# Patient Record
Sex: Female | Born: 1980 | Race: White | Hispanic: No | Marital: Married | Smoking: Never smoker
Health system: Southern US, Community
[De-identification: ages and names within clinical notes are randomized; demographics above are authoritative.]

## PROBLEM LIST (undated history)

## (undated) DIAGNOSIS — T7840XA Allergy, unspecified, initial encounter: Secondary | ICD-10-CM

## (undated) DIAGNOSIS — K5900 Constipation, unspecified: Secondary | ICD-10-CM

## (undated) DIAGNOSIS — B070 Plantar wart: Secondary | ICD-10-CM

## (undated) DIAGNOSIS — L609 Nail disorder, unspecified: Secondary | ICD-10-CM

## (undated) DIAGNOSIS — R079 Chest pain, unspecified: Secondary | ICD-10-CM

## (undated) DIAGNOSIS — F419 Anxiety disorder, unspecified: Secondary | ICD-10-CM

## (undated) HISTORY — DX: Nail disorder, unspecified: L60.9

## (undated) HISTORY — DX: Constipation, unspecified: K59.00

## (undated) HISTORY — DX: Anxiety disorder, unspecified: F41.9

## (undated) HISTORY — DX: Plantar wart: B07.0

## (undated) HISTORY — DX: Chest pain, unspecified: R07.9

## (undated) HISTORY — DX: Allergy, unspecified, initial encounter: T78.40XA

---

## 2019-02-23 HISTORY — PX: APPENDECTOMY: SHX54

## 2019-03-02 DIAGNOSIS — Z9049 Acquired absence of other specified parts of digestive tract: Secondary | ICD-10-CM | POA: Insufficient documentation

## 2020-06-01 LAB — RESULTS CONSOLE HPV: CHL HPV: NEGATIVE

## 2020-06-01 LAB — HM PAP SMEAR: HM Pap smear: NEGATIVE

## 2020-06-04 ENCOUNTER — Other Ambulatory Visit: Payer: Self-pay

## 2020-06-04 ENCOUNTER — Encounter: Payer: Self-pay | Admitting: Physician Assistant

## 2020-06-04 ENCOUNTER — Ambulatory Visit (INDEPENDENT_AMBULATORY_CARE_PROVIDER_SITE_OTHER): Payer: BC Managed Care – PPO | Admitting: Physician Assistant

## 2020-06-04 ENCOUNTER — Other Ambulatory Visit: Payer: Self-pay | Admitting: Obstetrics and Gynecology

## 2020-06-04 VITALS — BP 112/80 | HR 74 | Temp 98.3°F | Resp 14 | Ht 62.5 in | Wt 125.0 lb

## 2020-06-04 DIAGNOSIS — K5901 Slow transit constipation: Secondary | ICD-10-CM | POA: Diagnosis not present

## 2020-06-04 DIAGNOSIS — U099 Post covid-19 condition, unspecified: Secondary | ICD-10-CM

## 2020-06-04 DIAGNOSIS — R079 Chest pain, unspecified: Secondary | ICD-10-CM

## 2020-06-04 DIAGNOSIS — B070 Plantar wart: Secondary | ICD-10-CM

## 2020-06-04 DIAGNOSIS — N6320 Unspecified lump in the left breast, unspecified quadrant: Secondary | ICD-10-CM

## 2020-06-04 MED ORDER — IMIQUIMOD 5 % EX CREA: TOPICAL_CREAM | CUTANEOUS | 2 refills | Status: AC
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
## 2020-06-04 NOTE — Patient Instructions (Signed)
Please schedule for a Chest XRAY in our office at your convenience. Someone will call about referral to cardiology. Use Aldara cream as directed for plantar wart. Call in the next 3 months if not improved.  Be sure to drink plenty of water and healthy fiber intake. Great job taking care of yourself and running! Pleasure meeting you today.

## 2020-06-08 ENCOUNTER — Other Ambulatory Visit: Payer: Self-pay

## 2020-06-08 ENCOUNTER — Ambulatory Visit (INDEPENDENT_AMBULATORY_CARE_PROVIDER_SITE_OTHER): Payer: BC Managed Care – PPO

## 2020-06-08 ENCOUNTER — Other Ambulatory Visit: Payer: BC Managed Care – PPO

## 2020-06-08 DIAGNOSIS — R079 Chest pain, unspecified: Secondary | ICD-10-CM

## 2020-06-08 DIAGNOSIS — U099 Post covid-19 condition, unspecified: Secondary | ICD-10-CM
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
## 2020-06-17 ENCOUNTER — Other Ambulatory Visit: Payer: Self-pay

## 2020-06-17 ENCOUNTER — Ambulatory Visit: Payer: BC Managed Care – PPO | Admitting: Cardiovascular Disease

## 2020-06-17 ENCOUNTER — Encounter: Payer: Self-pay | Admitting: Cardiovascular Disease

## 2020-06-17 VITALS — BP 116/70 | HR 69 | Ht 63.0 in | Wt 125.0 lb

## 2020-06-17 DIAGNOSIS — R079 Chest pain, unspecified: Secondary | ICD-10-CM | POA: Diagnosis not present

## 2020-06-17 NOTE — Patient Instructions (Signed)
Medication Instructions:  *If you need a refill on your cardiac medications before your next appointment, please call your pharmacy*   Lab Work: If you have labs (blood work) drawn today and your tests are completely normal, you will receive your results only by: Marland Kitchen MyChart Message (if you have MyChart) OR . A paper copy in the mail If you have any lab test that is abnormal or we need to change your treatment, we will call you to review the results.  Testing/Procedures: Your physician has requested that you have an echocardiogram. Echocardiography is a painless test that uses sound waves to create images of your heart. It provides your doctor with information about the size and shape of your heart and how well your heart's chambers and valves are working. This procedure takes approximately one hour. There are no restrictions for this procedure.  Cardiac CT scanning for Calcium score, (CAT scanning), is a noninvasive, special x-ray that produces cross-sectional images of the body using x-rays and a computer. CT scans help physicians diagnose and treat medical conditions. For some CT exams, a contrast material is used to enhance visibility in the area of the body being studied. CT scans provide greater clarity and reveal more details than regular x-ray exams.   Follow-Up: At [facility], you and your health needs are our priority.  As part of our continuing mission to provide you with exceptional heart care, we have created designated Provider Care Teams.  These Care Teams include your primary Cardiologist (physician) and Advanced Practice Providers (APPs -  Physician Assistants and Nurse Practitioners) who all work together to provide you with the care you need, when you need it.  We recommend signing up for the patient portal called "MyChart".  Sign up information is provided on this After Visit Summary.  MyChart is used to connect with patients for Virtual Visits (Telemedicine).  Patients are  able to view lab/test results, encounter notes, upcoming appointments, etc.  Non-urgent messages can be sent to your provider as well.   To learn more about what you can do with MyChart, go to [email or web address].    Your next appointment:   As needed  The format for your next appointment:   In Person  Provider:   You may see Dr. Eden Emms or one of the following Advanced Practice Providers on your designated Care Team:    Georgie Chard, NP

## 2020-06-18 ENCOUNTER — Ambulatory Visit (INDEPENDENT_AMBULATORY_CARE_PROVIDER_SITE_OTHER)
Admission: RE | Admit: 2020-06-18 | Discharge: 2020-06-18 | Disposition: A | Discharge status: Home / Self Care | Payer: Self-pay | Source: Ambulatory Visit | Attending: Cardiovascular Disease | Admitting: Cardiovascular Disease

## 2020-06-18 ENCOUNTER — Ambulatory Visit (HOSPITAL_COMMUNITY): Payer: BC Managed Care – PPO | Attending: Internal Medicine

## 2020-06-18 DIAGNOSIS — R079 Chest pain, unspecified: Secondary | ICD-10-CM

## 2020-06-18 LAB — ECHOCARDIOGRAM COMPLETE
Area-P 1/2: 3.17 cm2
S' Lateral: 2.6 cm

## 2020-07-16 ENCOUNTER — Other Ambulatory Visit: Payer: Self-pay

## 2020-08-03 ENCOUNTER — Other Ambulatory Visit: Payer: Self-pay

## 2021-07-18 ENCOUNTER — Ambulatory Visit: Payer: BC Managed Care – PPO | Admitting: Physician Assistant

## 2021-07-18 VITALS — BP 102/66 | HR 76 | Temp 98.3°F | Ht 63.0 in | Wt 123.2 lb

## 2021-07-18 DIAGNOSIS — B309 Viral conjunctivitis, unspecified: Secondary | ICD-10-CM | POA: Diagnosis not present

## 2021-07-18 NOTE — Progress Notes (Signed)
? ?Subjective:  ? ? Patient ID: Katrina Schneider, female    DOB: 1980-05-10, 41 y.o.   MRN: [id number] ? ?Chief Complaint  ?Patient presents with  ? Conjunctivitis  ? ? ?Conjunctivitis  ? ?Patient is in today for ? Left eye conjunctivitis. Started this morning. White goop coming out of eye. Some pain, itching, and burning. No change in vision. Works with kindergartners. Renato Gails recently with pink eye as well.  ? ?Started feeling sick on Friday afternoon - felt like she was having hot/cold chills. Some congestion. Still able to run 12 miles yesterday. No cough. No headache. No dizziness. Just doesn't feel 100% she says.  ? ? ?Past Medical History:  ?Diagnosis Date  ? Allergy   ? Anxiety   ? Chest pain   ? Constipation   ? Nail problem   ? Plantar wart of right foot   ? ? ?Past Surgical History:  ?Procedure Laterality Date  ? APPENDECTOMY  02/2019  ? ? ?Family History  ?Problem Relation Age of Onset  ? Heart attack Maternal Grandmother   ? Heart attack Maternal Grandfather   ? Heart attack Paternal Grandmother   ? Drug abuse Paternal Grandfather   ? ? ?Social History  ? ?Tobacco Use  ? Smoking status: Never  ? Smokeless tobacco: Never  ?Vaping Use  ? Vaping Use: Never used  ?Substance Use Topics  ? Alcohol use: Yes  ?  Alcohol/week: 3.0 standard drinks  ?  Types: 3 Glasses of wine per week  ? Drug use: Never  ?  ? ?Allergies  ?Allergen Reactions  ? Cat Hair Extract Hives and Itching  ? ? ?Review of Systems ?NEGATIVE UNLESS OTHERWISE INDICATED IN HPI ? ? ?   ?Objective:  ?  ? ?BP 102/66   Pulse 76   Temp 98.3 ?F (36.8 ?C)   Ht 5\' 3"  (1.6 m)   Wt 123 lb 3.2 oz (55.9 kg)   SpO2 98%   BMI 21.82 kg/m?  ? ?Wt Readings from Last 3 Encounters:  ?07/18/21 123 lb 3.2 oz (55.9 kg)  ?06/17/20 125 lb (56.7 kg)  ?06/04/20 125 lb (56.7 kg)  ? ? ?BP Readings from Last 3 Encounters:  ?07/18/21 102/66  ?06/17/20 116/70  ?06/04/20 112/80  ?  ? ?Physical Exam ?Vitals and nursing note reviewed.  ?Constitutional:   ?   Appearance: Normal  appearance.  ?HENT:  ?   Head: Normocephalic.  ?   Right Ear: Tympanic membrane and external ear normal.  ?   Left Ear: Tympanic membrane and external ear normal.  ?   Nose: Congestion present.  ?   Mouth/Throat:  ?   Mouth: Mucous membranes are moist.  ?   Pharynx: No oropharyngeal exudate or posterior oropharyngeal erythema.  ?Eyes:  ?   General:     ?   Left eye: No foreign body, discharge or hordeolum.  ?   Extraocular Movements: Extraocular movements intact.  ?   Conjunctiva/sclera:  ?   Left eye: Left conjunctiva is injected (mild).  ?   Pupils: Pupils are equal, round, and reactive to light.  ?Cardiovascular:  ?   Rate and Rhythm: Normal rate and regular rhythm.  ?   Pulses: Normal pulses.  ?   Heart sounds: Normal heart sounds.  ?Pulmonary:  ?   Effort: Pulmonary effort is normal.  ?   Breath sounds: Normal breath sounds.  ?Neurological:  ?   Mental Status: She is alert.  ?Psychiatric:     ?  Mood and Affect: Mood normal.  ? ? ?   ?Assessment & Plan:  ? ?Problem List Items Addressed This Visit   ?None ?Visit Diagnoses   ? ? Acute viral conjunctivitis of left eye    -  Primary  ? ?  ? ? ?Acute viral conjunctivitis of left eye ?Suspect viral conjunctivitis given other symptoms of congestion and some chills and fatigue.  As she works in a kindergarten setting, not unreasonable to start on polymyxin antibiotic drops that she already has at home; however, suggested to her if no improvement in 48 hours with those drops, discontinue and instead just focus on warm compresses and letting this run its course. Pt to keep me updated. Recheck prn.  ? ?This note was prepared with assistance of Dragon voice recognition software. Occasional wrong-word or sound-a-like substitutions may have occurred due to the inherent limitations of voice recognition software. ? ? ? ?Nafeesa Dils M Kambry Takacs, PA-C ?

## 2021-09-26 ENCOUNTER — Telehealth: Payer: Self-pay | Admitting: Physician Assistant

## 2021-09-26 NOTE — Telephone Encounter (Signed)
Caller states they will be traveling to [place] the end of July and need to know what vaccines they need to get to go to [place]. Want to know if they can get it done at the office  Please advise

## 2021-09-26 NOTE — Telephone Encounter (Signed)
Called pt and wanting to do Titer blood work to see if she needs any immunizations before going to [place]. Can we place an order for this so pt can get this done in our office and she will schedule an appt for visit to go over results.

## 2021-09-27 NOTE — Telephone Encounter (Signed)
Called pt to advise she would still need to come in for an office visit first to discuss medications and possible vaccines; pt stated she was trying to not have to come in but once to avoid co pays and potential costs. She wanted to speak with husband and would call back at another time to schedule an office visit.

## 2021-10-06 ENCOUNTER — Encounter: Payer: Self-pay | Admitting: Physician Assistant

## 2021-10-06 ENCOUNTER — Ambulatory Visit: Payer: BC Managed Care – PPO | Admitting: Physician Assistant

## 2021-10-06 VITALS — BP 112/68 | HR 77 | Temp 98.4°F | Ht 63.0 in | Wt 125.4 lb

## 2021-10-06 DIAGNOSIS — Z7184 Encounter for health counseling related to travel: Secondary | ICD-10-CM

## 2021-10-06 DIAGNOSIS — Z0184 Encounter for antibody response examination: Secondary | ICD-10-CM | POA: Diagnosis not present

## 2021-10-06 DIAGNOSIS — Z23 Encounter for immunization: Secondary | ICD-10-CM

## 2021-10-06 MED ORDER — ATOVAQUONE-PROGUANIL HCL 62.5-25 MG PO TABS: ORAL_TABLET | ORAL | 0 refills | Status: AC

## 2021-10-06 NOTE — Patient Instructions (Addendum)
Malarone dosing per CDC: Both adults and children should take one dose of atovaquone-proguanil per day starting a day or two before traveling to the area where malaria transmission occurs. They should take one dose per day while there, and for 7 consecutive days after leaving.  Tetanus updated today  Test for titers Hep A, Hep B, MMR  Already immune to chickenpox (natural immunity)  Polio - 1 dose, 4 weeks spacing, then 2nd dose; receive 3rd dose when back from trip

## 2021-10-06 NOTE — Progress Notes (Unsigned)
 Subjective:    Patient ID: Katrina Schneider, female    DOB: 05/16/1980, 41 y.o.   MRN: [id number]  Chief Complaint  Patient presents with  . Immunizations    Pt in to discuss going to [place] and vaccines that may be needed for her before going; pt has no way of accessing vaccine records from the past, nothing other than Covid reported in [place] registry; pt is wanting to know can she do a titer to see what she needs as far as vaccines;    HPI Patient is in today for travel advice. Leaving July 27th, will be gone 10 days.   Traveling to [place], [place], and then [place], [place]. Going with her husband & [employer] basketball team for some games.  Traveling with team doctor. "Westernized bubble" in a foreign country.   Mom was hesitant on some vaccines when patient was a child due to some reactions. Says she hasn't had all vaccines.   No symptoms or other concerns at this time today.   Past Medical History:  Diagnosis Date  . Allergy   . Anxiety   . Chest pain   . Constipation   . Nail problem   . Plantar wart of right foot     Past Surgical History:  Procedure Laterality Date  . APPENDECTOMY  02/2019    Family History  Problem Relation Age of Onset  . Heart attack Maternal Grandmother   . Heart attack Maternal Grandfather   . Heart attack Paternal Grandmother   . Drug abuse Paternal Grandfather     Social History   Tobacco Use  . Smoking status: Never  . Smokeless tobacco: Never  Vaping Use  . Vaping Use: Never used  Substance Use Topics  . Alcohol use: Yes    Alcohol/week: 3.0 standard drinks of alcohol    Types: 3 Glasses of wine per week  . Drug use: Never     Allergies  Allergen Reactions  . Cat Hair Extract Hives and Itching    Review of Systems NEGATIVE UNLESS OTHERWISE INDICATED IN HPI      Objective:     BP 112/68 (BP Location: Right Arm)   Pulse 77   Temp 98.4 F (36.9 C) (Temporal)   Ht 5' 3" (1.6 m)   Wt 125 lb 6.4 oz (56.9 kg)   LMP 10/01/2021  (Exact Date)   SpO2 96%   BMI 22.21 kg/m   Wt Readings from Last 3 Encounters:  10/06/21 125 lb 6.4 oz (56.9 kg)  07/18/21 123 lb 3.2 oz (55.9 kg)  06/17/20 125 lb (56.7 kg)    BP Readings from Last 3 Encounters:  10/06/21 112/68  07/18/21 102/66  06/17/20 116/70     Physical Exam Constitutional:      Appearance: Normal appearance.  Cardiovascular:     Rate and Rhythm: Normal rate and regular rhythm.     Pulses: Normal pulses.     Heart sounds: Normal heart sounds.  Pulmonary:     Effort: Pulmonary effort is normal.     Breath sounds: Normal breath sounds.  Neurological:     Mental Status: She is alert and oriented to person, place, and time.  Psychiatric:        Mood and Affect: Mood normal.        Behavior: Behavior normal.       Assessment & Plan:   Problem List Items Addressed This Visit   None Visit Diagnoses     Travel advice encounter    -    Primary   Relevant Orders   Measles/Mumps/Rubella Immunity (Completed)   Hepatitis A antibody, IgM (Completed)   Hepatitis A antibody, total (Completed)   Hepatitis B surface antigen (Completed)   Hepatitis B surface antibody,qualitative (Completed)   Hepatitis B core antibody, total (Completed)   Immunity status testing       Relevant Orders   Measles/Mumps/Rubella Immunity (Completed)   Hepatitis A antibody, IgM (Completed)   Hepatitis A antibody, total (Completed)   Hepatitis B surface antigen (Completed)   Hepatitis B surface antibody,qualitative (Completed)   Hepatitis B core antibody, total (Completed)   Need for prophylactic vaccination with combined diphtheria-tetanus-pertussis (DTP) vaccine       Relevant Orders   Tdap vaccine greater than or equal to 7yo IM (Completed)        Meds ordered this encounter  Medications  . Atovaquone-Proguanil HCl 62.5-25 MG tablet    Sig: Take one dose of atovaquone-proguanil per day starting a day or two before traveling to the area where malaria transmission  occurs. They should take one dose per day while there, and for 7 consecutive days after leaving.    Dispense:  21 tablet    Refill:  0    Order Specific Question:   Supervising Provider    Answer:   HUNTER, STEPHEN O [[id number]]    PLAN: I personally looked up CDC travel recommendations with patient while in the exam room.   We went ahead and updated her tetanus today. Plan to check titers to hepatitis A, hepatitis B, MMR. Deferred varicella titer as she already has natural immunity. I prescribed Malarone for her to take as directed per CDC dosing.   This note was prepared with assistance of Dragon voice recognition software. Occasional wrong-word or sound-a-like substitutions may have occurred due to the inherent limitations of voice recognition software.  Time Spent: 38 minutes of total time was spent on the date of the encounter performing the following actions: chart review prior to seeing the patient, obtaining history, performing a medically necessary exam, counseling on the treatment plan, placing orders, and documenting in our EHR.      Alyssa M Allwardt, PA-C 

## 2021-10-07 LAB — HEPATITIS B CORE ANTIBODY, TOTAL: Hep B Core Total Ab: NONREACTIVE

## 2021-10-07 LAB — HEPATITIS B SURFACE ANTIGEN: Hepatitis B Surface Ag: NONREACTIVE

## 2021-10-07 LAB — MEASLES/MUMPS/RUBELLA IMMUNITY
Mumps IgG: <9 AU/mL — ABNORMAL LOW
Rubella: <0.9 Index — ABNORMAL LOW
Rubeola IgG: <13.5 AU/mL — ABNORMAL LOW

## 2021-10-07 LAB — HEPATITIS A ANTIBODY, IGM: Hep A IgM: NONREACTIVE

## 2021-10-07 LAB — HEPATITIS B SURFACE ANTIBODY,QUALITATIVE: Hep B S Ab: NONREACTIVE

## 2021-10-07 LAB — HEPATITIS A ANTIBODY, TOTAL: Hepatitis A AB,Total: NONREACTIVE

## 2021-10-14 ENCOUNTER — Telehealth: Payer: Self-pay

## 2021-10-18 ENCOUNTER — Ambulatory Visit: Payer: BC Managed Care – PPO

## 2021-11-04 ENCOUNTER — Encounter: Payer: Self-pay | Admitting: Physician Assistant

## 2021-12-15 ENCOUNTER — Telehealth: Payer: Self-pay | Admitting: Physician Assistant

## 2021-12-15 NOTE — Telephone Encounter (Signed)
  Has patient reached out to billing?      If yes, what did billing advise the patient?  To contact the Doctor's office.      Patient Name: Katrina Schneider MRN: [id number]  [place] ID:  [id number] DOB: 1980-06-24 Date of Service:  10/06/2021 Amount:  $230.00   Additional Comments:   Pt states: -Consultation fee is what is being charged. -did not want to come in for these questions but was told that the information could not be provided over the phone. -feels like she has been tricked to come in so she could be billed.   Please advise patient that we do not handle billing in the practice.  We will submit this concern to billing leadership.  The patient will be followed up with either by Billing leadership or Billing Customer Service.

## 2021-12-23 ENCOUNTER — Encounter: Payer: Self-pay | Admitting: Physician Assistant

## 2021-12-23 NOTE — Telephone Encounter (Signed)
Sent email to billing  

## 2021-12-27 NOTE — Telephone Encounter (Signed)
Please see pt note and advise 

## 2021-12-28 NOTE — Telephone Encounter (Signed)
Forwarding to [facility] for coding review.

## 2021-12-30 NOTE — Telephone Encounter (Signed)
I have sent email to [facility] and advised patient.

## 2022-01-16 ENCOUNTER — Encounter: Payer: Self-pay | Admitting: *Deleted

## 2022-04-06 ENCOUNTER — Encounter: Payer: Self-pay | Admitting: *Deleted

## 2022-04-20 NOTE — Telephone Encounter (Signed)
Patient states: - She is calling back since she hasn't gotten any updates on her situation  - She just got another bill from [facility] for $800 for the same 6/15 visit - Is feeling frustrated due to the ongoing bill [employer] stated the bill was coded in a way that it wouldn't be covered by them   I informed pt that office manager, Burnett Kanaris, will be giving her a callback once all her information is looked over once again. Pt verbalized understanding and will be waiting for call.

## 2022-07-03 NOTE — Telephone Encounter (Signed)
Pt is still waiting on a call back about the coding for last appt. Please call pt back asap.

## 2022-07-03 NOTE — Telephone Encounter (Signed)
This will need to be sent to [facility], correct? We don't handle billing issues?

## 2022-07-04 NOTE — Telephone Encounter (Signed)
Please see message and advise, thanks

## 2022-07-18 NOTE — Telephone Encounter (Signed)
This is the patient I was talking about this morning in regards to the billing issue. Please let me know if it's something I can do on my end, thanks

## 2022-10-05 IMAGING — CT CT CARDIAC CORONARY ARTERY CALCIUM SCORE
3 series · 14 of 20 positions shown, 15 images · non-contrast
Comparison: None.
COMPARISON: None.

Addendum:
EXAM:
OVER-READ INTERPRETATION  CT CHEST

The following report is an over-read performed by radiologist Dr.
Mofidul Tiger [REDACTED] on 06/18/2020. This
over-read does not include interpretation of cardiac or coronary
anatomy or pathology. The coronary calcium score/coronary CTA
interpretation by the cardiologist is attached.
CLINICAL DATA: Risk stratification
Coronary Calcium Score
TECHNIQUE: The patient was scanned on a Siemens Somatom 64 slice scanner. Axial
non-contrast 3 mm slices were carried out through the heart. The
data set was analyzed on a dedicated work station and scored using
the Agatson method.

[Series 2: casc 3.0 bv41 2 bestdiast 69 % · axial · 0.34mm/px · z∈[-191,-110]mm · 4 of 46 slices shown, 5 images]
[im 10/46  vessel]
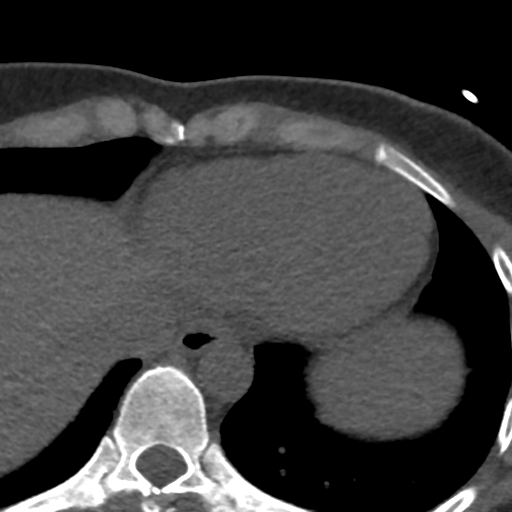
[im 10/46  lung]
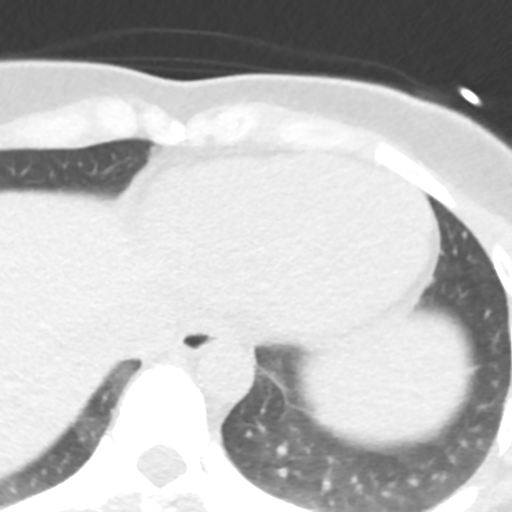
[im 19/46  vessel]
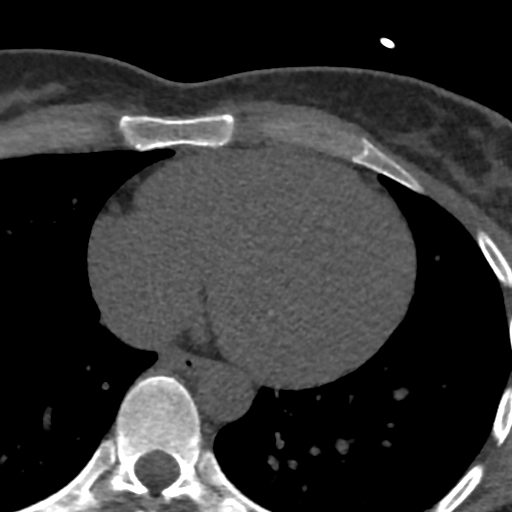
[im 28/46  vessel]
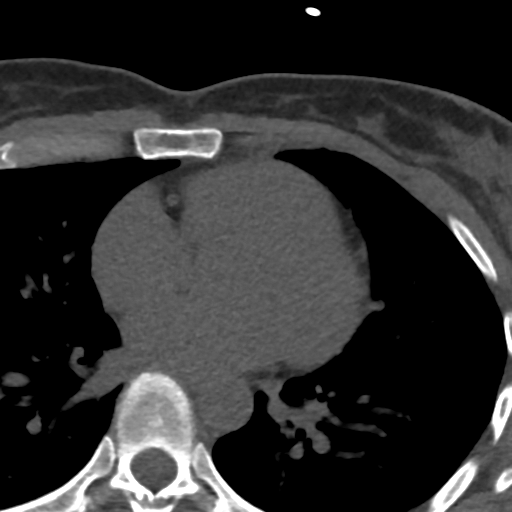
[im 37/46  vessel]
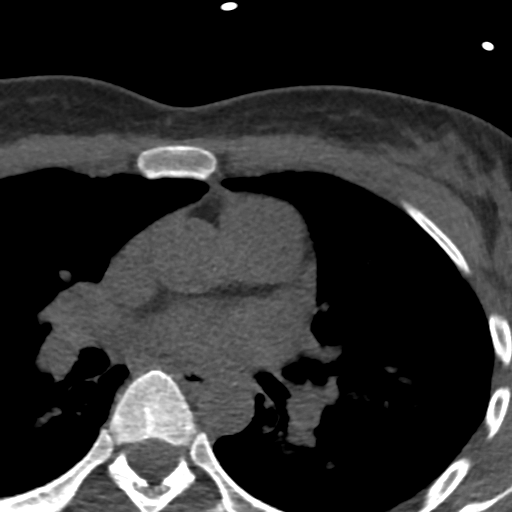

[Series 3: lung 70 % · axial · 0.65mm/px · z∈[-197,-107]mm · 5 of 46 slices shown]
[im 8/46  lung]
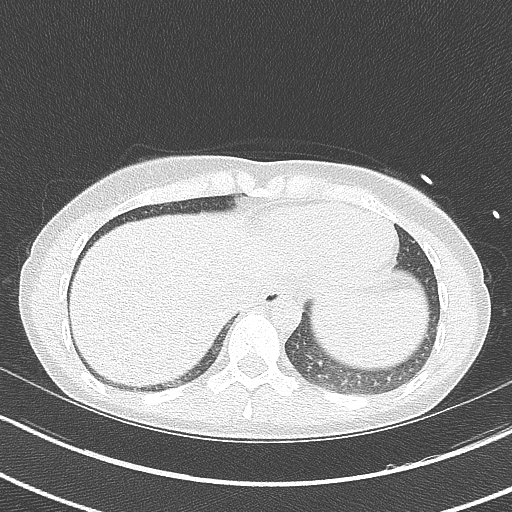
[im 16/46  lung]
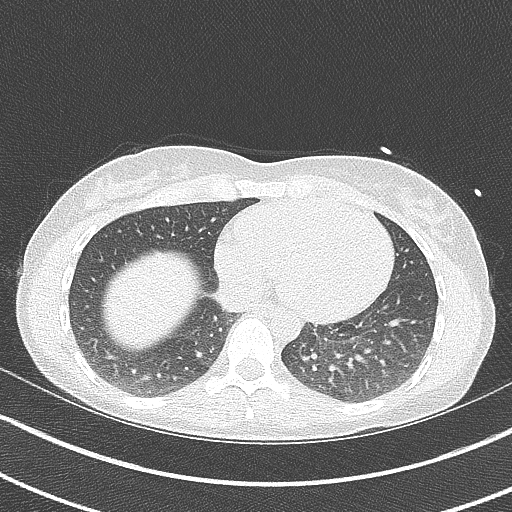
[im 23/46  lung]
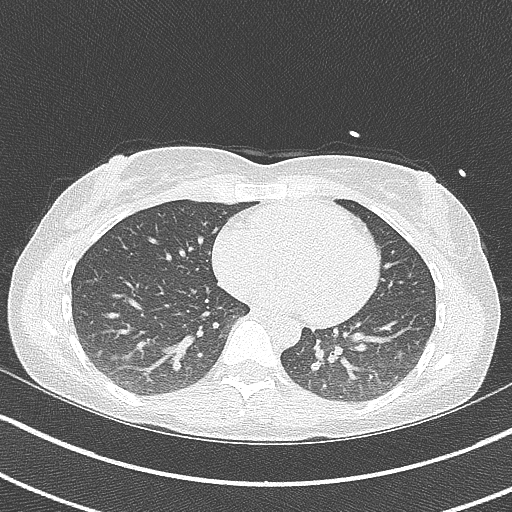
[im 31/46  lung]
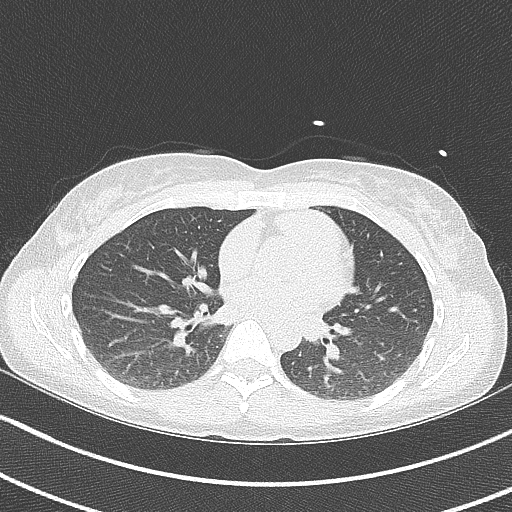
[im 38/46  lung]
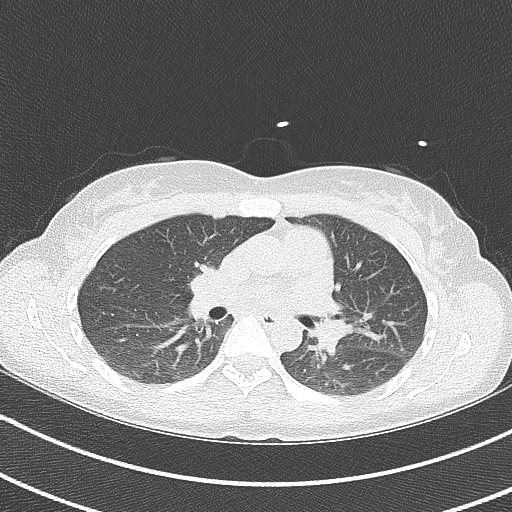

[Series 4: lung st 70 % · axial · 0.65mm/px · z∈[-197,-107]mm · 5 of 46 slices shown]
[im 8/46  lung]
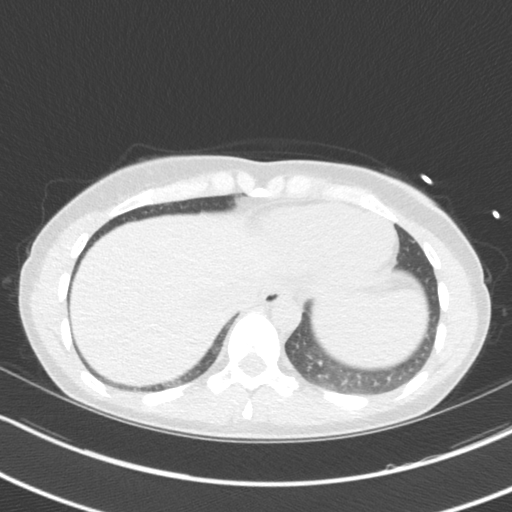
[im 16/46  lung]
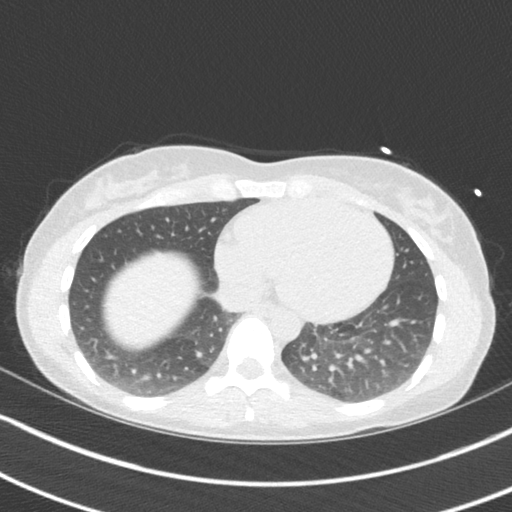
[im 23/46  lung]
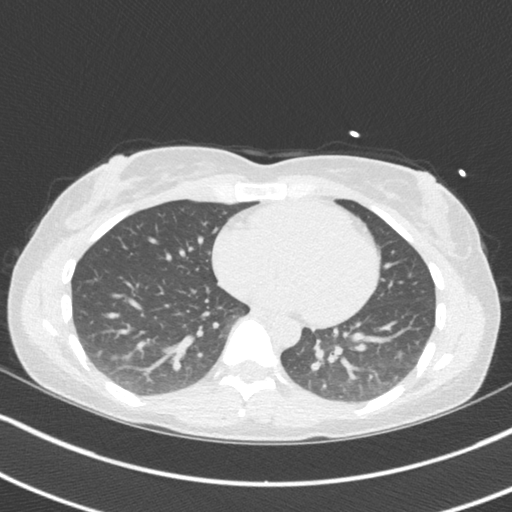
[im 31/46  lung]
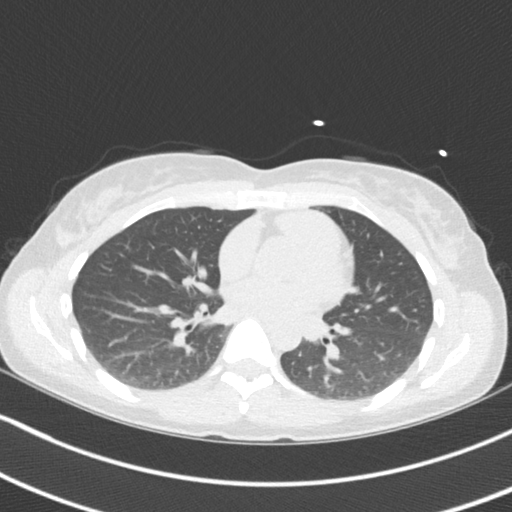
[im 38/46  lung]
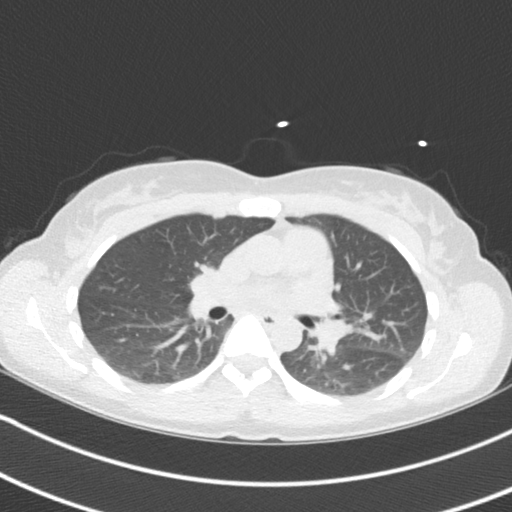

[14 of 20 positions shown; findings below may reference images not displayed]

FINDINGS: Within the visualized portions of the thorax there are no suspicious
appearing pulmonary nodules or masses, there is no acute
consolidative airspace disease, no pleural effusions, no
pneumothorax and no lymphadenopathy. Visualized portions of the
upper abdomen are unremarkable. There are no aggressive appearing
lytic or blastic lesions noted in the visualized portions of the
skeleton.
IMPRESSION: No significant incidental noncardiac findings are noted.
FINDINGS: Non-cardiac: See separate report from [REDACTED].

Ascending aorta: Normal diameter 2.6 cm

Pericardium: Normal

Coronary arteries: No calcium noted
IMPRESSION: Coronary calcium score of 0.

Dora Yohannes

*** End of Addendum ***
EXAM:
OVER-READ INTERPRETATION  CT CHEST

The following report is an over-read performed by radiologist Dr.
Mofidul Tiger [REDACTED] on 06/18/2020. This
over-read does not include interpretation of cardiac or coronary
anatomy or pathology. The coronary calcium score/coronary CTA
interpretation by the cardiologist is attached.
FINDINGS: Within the visualized portions of the thorax there are no suspicious
appearing pulmonary nodules or masses, there is no acute
consolidative airspace disease, no pleural effusions, no
pneumothorax and no lymphadenopathy. Visualized portions of the
upper abdomen are unremarkable. There are no aggressive appearing
lytic or blastic lesions noted in the visualized portions of the
skeleton.
IMPRESSION: No significant incidental noncardiac findings are noted.
